# Patient Record
Sex: Male | Born: 1995 | Race: White | Hispanic: No | Marital: Single | Smoking: Never smoker
Health system: Southern US, Community
[De-identification: ages and names within clinical notes are randomized; demographics above are authoritative.]

## PROBLEM LIST (undated history)

## (undated) DIAGNOSIS — S83206A Unspecified tear of unspecified meniscus, current injury, right knee, initial encounter: Secondary | ICD-10-CM

## (undated) DIAGNOSIS — S83519A Sprain of anterior cruciate ligament of unspecified knee, initial encounter: Secondary | ICD-10-CM

## (undated) HISTORY — PX: SUPERFICIAL LYMPH NODE BIOPSY / EXCISION: SUR127

---
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
## 2016-09-10 ENCOUNTER — Ambulatory Visit (INDEPENDENT_AMBULATORY_CARE_PROVIDER_SITE_OTHER): Payer: BLUE CROSS/BLUE SHIELD | Admitting: Nurse Practitioner

## 2016-09-10 ENCOUNTER — Encounter: Payer: Self-pay | Admitting: Nurse Practitioner

## 2016-09-10 VITALS — BP 114/68 | HR 77 | Temp 97.8°F | Ht 74.0 in | Wt 180.0 lb

## 2016-09-10 DIAGNOSIS — R6889 Other general symptoms and signs: Secondary | ICD-10-CM | POA: Diagnosis not present

## 2016-09-10 DIAGNOSIS — N529 Male erectile dysfunction, unspecified: Secondary | ICD-10-CM | POA: Insufficient documentation

## 2016-09-10 DIAGNOSIS — L309 Dermatitis, unspecified: Secondary | ICD-10-CM | POA: Insufficient documentation

## 2016-09-10 DIAGNOSIS — R209 Unspecified disturbances of skin sensation: Secondary | ICD-10-CM

## 2016-09-10 MED ORDER — TRIAMCINOLONE ACETONIDE 0.5 % EX OINT: 1.0000 "application " | TOPICAL_OINTMENT | Freq: Two times a day (BID) | CUTANEOUS | 1 refills | Status: DC

## 2016-09-10 NOTE — Progress Notes (Signed)
Subjective:    Patient ID: Erik Barr, male    DOB: 1996-06-28, 21 y.o.   MRN: [id number]  Patient presents today for establish care (new patient) and evaluation for ED and rash.  Erectile Dysfunction  This is a new problem. The current episode started more than 1 month ago. The problem is unchanged. The nature of his difficulty is maintaining erection. He reports no anxiety, decreased libido or performance anxiety. He reports his erection duration to be 5 to 10 minutes. Irritative symptoms do not include frequency, nocturia or urgency. Obstructive symptoms do not include dribbling, incomplete emptying, an intermittent stream, straining or a weak stream. Pertinent negatives include no chills, dysuria, genital pain, hematuria, hesitancy or inability to urinate. Nothing aggravates the symptoms. Past treatments include nothing.  Rash  This is a recurrent problem. The current episode started more than 1 year ago. The problem has been waxing and waning since onset. The affected locations include the back, left arm and right arm. The rash is characterized by redness, scaling, itchiness and dryness. He was exposed to nothing. Pertinent negatives include no anorexia, congestion, cough, diarrhea, eye pain, facial edema, fatigue, fever, joint pain, nail changes, rhinorrhea, shortness of breath, sore throat or vomiting. Past treatments include moisturizer. The treatment provided mild relief. His past medical history is significant for allergies and eczema. There is no history of asthma or varicella.    referred to urologist in past for Ed, but patient did not make appt.  Immunizations: (TDAP, Hep C screen, Pneumovax, Influenza, zoster)  Health Maintenance  Topic Date Due  . Tetanus Vaccine  01/14/2015  . HIV Screening  08/26/2017*  . Flu Shot  Addressed  *Topic was postponed. The date shown is not the original due date.   Diet:regular Weight:  Wt Readings from Last 3 Encounters:  09/10/16 180 lb  (81.6 kg)  09/02/12 181 lb (82.1 kg) (92 %, Z= 1.38)*   * Growth percentiles are based on CDC 2-20 Years data.   Exercise:none Fall Risk: Fall Risk  09/10/2016  Falls in the past year? No   Home Safety:home with girlfriend Depression/Suicide: Depression screen [facility] 2/9 09/10/2016  Decreased Interest 0  Down, Depressed, Hopeless 0  PHQ - 2 Score 0   No flowsheet data found.  Vision:up to date Dental:needed Advanced Directive: Advanced Directives 08/31/2012  Does Patient Have a Medical Advance Directive? Not applicable, patient <36 years old   Sexual History (birth control, marital status, STD):single, sexually active, unprotected.  Medications and allergies reviewed with patient and updated if appropriate.  Patient Active Problem List   Diagnosis Date Noted  . ED (erectile dysfunction) 09/10/2016  . Eczema 09/10/2016    No current outpatient prescriptions on file prior to visit.   No current facility-administered medications on file prior to visit.     Past Medical History:  Diagnosis Date  . ACL tear 08/2012   right  . Right knee meniscal tear 08/2012   medial and lateral    Past Surgical History:  Procedure Laterality Date  . ANTERIOR CRUCIATE LIGAMENT REPAIR  09/02/2012   Procedure: ANTERIOR CRUCIATE LIGAMENT (ACL) REPAIR;  Surgeon: Alta Corning, MD;  Location: [place];  Service: Orthopedics;  Laterality: Right;  Anterior Cruciate Ligament Reconstruction with Autograft and Menicus Repair  . SUPERFICIAL LYMPH NODE BIOPSY / EXCISION      Social History   Social History  . Marital status: Single    Spouse name: N/A  . Number of children:  N/A  . Years of education: N/A   Social History Main Topics  . Smoking status: Never Smoker  . Smokeless tobacco: Never Used  . Alcohol use 7.2 oz/week    4 Cans of beer, 8 Shots of liquor per week  . Drug use: Yes    Frequency: 4.0 times per week    Types: Marijuana  . Sexual activity: Yes    Birth  control/ protection: None   Other Topics Concern  . None   Social History Narrative  . None    Family History  Problem Relation Age of Onset  . Diabetes Father   . Anesthesia problems Father     post-op N/V  . Heart disease Brother     hx. Kawasaki disease - now symptom-free  . Asthma Maternal Aunt   . Diabetes Paternal Uncle   . Diabetes Paternal Grandmother   . Hypertension Paternal Grandmother         Review of Systems  Constitutional: Negative for chills, fatigue, fever, malaise/fatigue and weight loss.  HENT: Negative for congestion, rhinorrhea and sore throat.   Eyes: Negative for pain.       Negative for visual changes  Respiratory: Negative for cough and shortness of breath.   Cardiovascular: Negative for chest pain, palpitations and leg swelling.  Gastrointestinal: Negative for anorexia, blood in stool, constipation, diarrhea, heartburn and vomiting.  Genitourinary: Negative for decreased libido, dysuria, frequency, hematuria, hesitancy, incomplete emptying, nocturia and urgency.  Musculoskeletal: Negative for falls, joint pain and myalgias.  Skin: Positive for rash. Negative for nail changes.  Neurological: Negative for dizziness, sensory change and headaches.  Endo/Heme/Allergies: Does not bruise/bleed easily.  Psychiatric/Behavioral: Negative for depression, hallucinations, substance abuse and suicidal ideas. The patient is not nervous/anxious and does not have insomnia.     Objective:   Vitals:   09/10/16 1436  BP: 114/68  Pulse: 77  Temp: 97.8 F (36.6 C)    Body mass index is 23.11 kg/m.   Physical Examination:  Physical Exam  Constitutional: He is oriented to person, place, and time and well-developed, well-nourished, and in no distress. No distress.  HENT:  Right Ear: External ear normal.  Left Ear: External ear normal.  Nose: Nose normal.  Mouth/Throat: Oropharynx is clear and moist. No oropharyngeal exudate.  Eyes: Conjunctivae and EOM  are normal. Pupils are equal, round, and reactive to light. No scleral icterus.  Neck: Normal range of motion. Neck supple. No thyromegaly present.  Cardiovascular: Normal rate, normal heart sounds and intact distal pulses.   Pulmonary/Chest: Effort normal and breath sounds normal. He exhibits no tenderness.  Abdominal: Soft. Bowel sounds are normal. He exhibits no distension. There is no tenderness.  Musculoskeletal: Normal range of motion. He exhibits no edema or tenderness.  Lymphadenopathy:    He has no cervical adenopathy.  Neurological: He is alert and oriented to person, place, and time. No cranial nerve deficit. Gait normal. Coordination normal.  Skin: Skin is warm and dry. Rash noted. Rash is macular.     Psychiatric: Affect and judgment normal.    ASSESSMENT and PLAN:  Rowyn was seen today for establish care.  Diagnoses and all orders for this visit:  Erectile dysfunction, unspecified erectile dysfunction type -     CBC w/Diff; Future -     Comp Met (CMET); Future -     TSH; Future -     Testosterone; Future  Cold extremities -     CBC w/Diff; Future -  Comp Met (CMET); Future -     TSH; Future -     Testosterone; Future  Eczema, unspecified type -     triamcinolone ointment (KENALOG) 0.5 %; Apply 1 application topically 2 (two) times daily.   No problem-specific Assessment & Plan notes found for this encounter.     Follow up: Return in about 3 months (around 12/09/2016) for erectile dysfunction and eczema.  Wilfred Lacy, NP

## 2016-09-10 NOTE — Patient Instructions (Addendum)
Refer to urologist if normal labs.  Atopic Dermatitis Atopic dermatitis is a skin disorder that causes inflammation of the skin. This is the most common type of eczema. Eczema is a group of skin conditions that cause the skin to be itchy, red, and swollen. This condition is generally worse during the cooler winter months and often improves during the warm summer months. Symptoms can vary from person to person. Atopic dermatitis usually starts showing signs in infancy and can last through adulthood. This condition cannot be passed from one person to another (non-contagious), but is more common in families. Atopic dermatitis may not always be present. When it is present, it is called a flare-up. What are the causes? The exact cause of this condition is not known. Flare-ups of the condition may be triggered by:  Contact with something you are sensitive or allergic to.  Stress.  Certain foods.  Extremely hot or cold weather.  Harsh chemicals and soaps.  Dry air.  Chlorine. What increases the risk? This condition is more likely to develop in people who have a personal history or family history of eczema, allergies, asthma, or hay fever. What are the signs or symptoms? Symptoms of this condition include:  Dry, scaly skin.  Red, itchy rash.  Itchiness, which can be severe. This may occur before the skin rash. This can make sleeping difficult.  Skin thickening and cracking can occur over time. How is this diagnosed? This condition is diagnosed based on your symptoms, a medical history, and a physical exam. How is this treated? There is no cure for this condition, but symptoms can usually be controlled. Treatment focuses on:  Controlling the itching and scratching. You may be given medicines, such as antihistamines or steroid creams.  Limiting exposure to things that you are sensitive or allergic to (allergens).  Recognizing situations that cause stress and developing a plan to  manage stress. If your atopic dermatitis does not get better with medicines or is all over your body (widespread) , a treatment using a specific type of light (phototherapy) may be used. Follow these instructions at home: Skin care  Keep your skin well-moisturized. This seals in moisture and help prevent dryness.  Use unscented lotions that have petroleum in them.  Avoid lotions that contain alcohol and water. They can dry the skin.  Keep baths or showers short (less than 5 minutes) in warm water. Do not use hot water.  Use mild, unscented cleansers for bathing. Avoid soap and bubble bath.  Apply a moisturizer to your skin right after a bath or shower.   Do not apply anything to your skin without checking with your health care provider. General instructions  Dress in clothes made of cotton or cotton blends. Dress lightly because heat increases itching.  When washing your clothes, rinse your clothes twice so all of the soap is removed.  Avoid any triggers that can cause a flare-up.  Try to manage your stress.  Keep your fingernails cut short.  Avoid scratching. Scratching makes the rash and itching worse. It may also result in a skin infection (impetigo) due to a break in the skin caused by scratching.  Take or apply over-the-counter and prescription medicines only as told by your health care provider.  Keep all follow-up visits as told by your health care provider. This is important.  Do not be around people who have cold sores or fever blisters. If you get the infection, it may cause your atopic dermatitis to worsen. Contact a  health care provider if:  Your itching interferes with sleep.  Your rash gets worse or is not better within one week of starting treatment.  You have a fever.  You have a rash flare-up after having contact with someone who has cold sores or fever blisters. Get help right away if:  You develop pus or soft yellow scabs in the rash  area. Summary  This condition causes a red rash and itchy, dry, scaly skin.  Treatment focuses on controlling the itching and scratching, limiting exposure to things that you are sensitive or allergic to (allergens), and recognizing situations that cause stress and developing a plan to manage stress.  Keep your skin well-moisturized.  Keep baths or showers less than 5 minutes. This information is not intended to replace advice given to you by your health care provider. Make sure you discuss any questions you have with your health care provider. Document Released: 08/09/2000 Document Revised: 01/18/2016 Document Reviewed: 03/15/2013 Elsevier Interactive Patient Education  2017 [employer]  Erectile Dysfunction Erectile dysfunction is the inability to get or sustain a good enough erection to have sexual intercourse. Erectile dysfunction may involve:  Inability to get an erection.  Lack of enough hardness to allow penetration.  Loss of the erection before sex is finished.  Premature ejaculation. CAUSES  Certain drugs, such as:  Pain relievers.  Antihistamines.  Antidepressants.  Blood pressure medicines.  Water pills (diuretics).  Ulcer medicines.  Muscle relaxants.  Illegal drugs.  Excessive drinking.  Psychological causes, such as:  Anxiety.  Depression.  Sadness.  Exhaustion.  Performance fear.  Stress.  Physical causes, such as:  Artery problems. This may include diabetes, smoking, liver disease, or atherosclerosis.  High blood pressure.  Hormonal problems, such as low testosterone.  Obesity.  Nerve problems. This may include back or pelvic injuries, diabetes mellitus, multiple sclerosis, or Parkinson disease. SYMPTOMS  Inability to get an erection.  Lack of enough hardness to allow penetration.  Loss of the erection before sex is finished.  Premature ejaculation.  Normal erections at some times, but with frequent unsatisfactory  episodes.  Orgasms that are not satisfactory in sensation or frequency.  Low sexual satisfaction in either partner because of erection problems.  A curved penis occurring with erection. The curve may cause pain or may be too curved to allow for intercourse.  Never having nighttime erections. DIAGNOSIS Your caregiver can often diagnose this condition by:  Performing a physical exam to find other diseases or specific problems with the penis.  Asking you detailed questions about the problem.  Performing blood tests to check for diabetes mellitus or to measure hormone levels.  Performing urine tests to find other underlying health conditions.  Performing an ultrasound exam to check for scarring.  Performing a test to check blood flow to the penis.  Doing a sleep study at home to measure nighttime erections. TREATMENT   You may be prescribed medicines by mouth.  You may be given medicine injections into the penis.  You may be prescribed a vacuum pump with a ring.  Penile implant surgery may be performed. You may receive:  An inflatable implant.  A semirigid implant.  Blood vessel surgery may be performed. HOME CARE INSTRUCTIONS  If you are prescribed oral medicine, you should take the medicine as prescribed. Do not increase the dosage without first discussing it with your physician.  If you are using self-injections, be careful to avoid any veins that are on the surface of the penis. Apply  pressure to the injection site for 5 minutes.  If you are using a vacuum pump, make sure you have read the instructions before using it. Discuss any questions with your physician before taking the pump home. SEEK MEDICAL CARE IF:  You experience pain that is not responsive to the pain medicine you have been prescribed.  You experience nausea or vomiting. SEEK IMMEDIATE MEDICAL CARE IF:   When taking oral or injectable medications, you experience an erection that lasts longer than 4  hours. If your physician is unavailable, go to the nearest emergency room for evaluation. An erection that lasts much longer than 4 hours can result in permanent damage to your penis.  You have pain that is severe.  You develop redness, severe pain, or severe swelling of your penis.  You have redness spreading up into your groin or lower abdomen.  You are unable to pass your urine. This information is not intended to replace advice given to you by your health care provider. Make sure you discuss any questions you have with your health care provider. Document Released: 08/09/2000 Document Revised: 04/14/2013 Document Reviewed: 01/14/2013 Elsevier Interactive Patient Education  2017 [employer].

## 2016-09-10 NOTE — Progress Notes (Signed)
Pre visit review using our clinic review tool, if applicable. No additional management support is needed unless otherwise documented below in the visit note. 

## 2016-09-13 ENCOUNTER — Other Ambulatory Visit (INDEPENDENT_AMBULATORY_CARE_PROVIDER_SITE_OTHER): Payer: BLUE CROSS/BLUE SHIELD

## 2016-09-13 DIAGNOSIS — N529 Male erectile dysfunction, unspecified: Secondary | ICD-10-CM | POA: Diagnosis not present

## 2016-09-13 DIAGNOSIS — R7989 Other specified abnormal findings of blood chemistry: Secondary | ICD-10-CM

## 2016-09-13 LAB — CBC WITH DIFFERENTIAL/PLATELET
Basophils Absolute: 0 10*3/uL (ref 0.0–0.1)
Basophils Relative: 0.2 % (ref 0.0–3.0)
EOS ABS: 0.1 10*3/uL (ref 0.0–0.7)
Eosinophils Relative: 2.1 % (ref 0.0–5.0)
HCT: 44.8 % (ref 39.0–52.0)
HEMOGLOBIN: 15.3 g/dL (ref 13.0–17.0)
LYMPHS ABS: 1.8 10*3/uL (ref 0.7–4.0)
Lymphocytes Relative: 26.9 % (ref 12.0–46.0)
MCHC: 34.1 g/dL (ref 30.0–36.0)
MCV: 87.3 fl (ref 78.0–100.0)
MONO ABS: 0.5 10*3/uL (ref 0.1–1.0)
Monocytes Relative: 8 % (ref 3.0–12.0)
NEUTROS PCT: 62.8 % (ref 43.0–77.0)
Neutro Abs: 4.3 10*3/uL (ref 1.4–7.7)
Platelets: 268 10*3/uL (ref 150.0–400.0)
RBC: 5.13 Mil/uL (ref 4.22–5.81)
RDW: 12 % (ref 11.5–14.6)
WBC: 6.8 10*3/uL (ref 4.5–10.5)

## 2016-09-13 LAB — COMPREHENSIVE METABOLIC PANEL
ALBUMIN: 4.6 g/dL (ref 3.5–5.2)
ALT: 11 U/L (ref 0–53)
AST: 16 U/L (ref 0–37)
Alkaline Phosphatase: 56 U/L (ref 39–117)
BUN: 12 mg/dL (ref 6–23)
CO2: 33 mEq/L — ABNORMAL HIGH (ref 19–32)
CREATININE: 1.15 mg/dL (ref 0.40–1.50)
Calcium: 9.6 mg/dL (ref 8.4–10.5)
Chloride: 100 mEq/L (ref 96–112)
GFR: 85.6 mL/min (ref >60.00)
GLUCOSE: 91 mg/dL (ref 70–99)
Potassium: 3.8 mEq/L (ref 3.5–5.1)
SODIUM: 140 meq/L (ref 135–145)
Total Bilirubin: 0.8 mg/dL (ref 0.2–1.2)
Total Protein: 7.2 g/dL (ref 6.0–8.3)

## 2016-09-13 LAB — TSH: TSH: 0.89 u[IU]/mL (ref 0.35–5.50)

## 2016-09-13 LAB — TESTOSTERONE: Testosterone: 309.4 ng/dL — ABNORMAL LOW (ref 350.00–890.00)

## 2016-09-16 ENCOUNTER — Telehealth: Payer: Self-pay | Admitting: Nurse Practitioner

## 2016-09-16 NOTE — Telephone Encounter (Signed)
Pt had a missed call from us, his phone ( listed in EPIC ) is not working. He is assuming we were calling with [facility] lab results [facility] Please return his call at [phone]

## 2016-09-27 ENCOUNTER — Other Ambulatory Visit (INDEPENDENT_AMBULATORY_CARE_PROVIDER_SITE_OTHER): Payer: BLUE CROSS/BLUE SHIELD

## 2016-09-27 DIAGNOSIS — N529 Male erectile dysfunction, unspecified: Secondary | ICD-10-CM

## 2016-09-27 DIAGNOSIS — E291 Testicular hypofunction: Secondary | ICD-10-CM

## 2016-09-27 DIAGNOSIS — R7989 Other specified abnormal findings of blood chemistry: Secondary | ICD-10-CM | POA: Insufficient documentation

## 2016-09-27 LAB — FOLLICLE STIMULATING HORMONE: FSH: 4.8 m[IU]/mL (ref 1.4–18.1)

## 2016-09-27 LAB — TESTOSTERONE: Testosterone: 497.41 ng/dL (ref 350.00–890.00)

## 2016-09-27 LAB — LUTEINIZING HORMONE: LH: 7.36 m[IU]/mL (ref 1.50–9.30)

## 2016-09-30 ENCOUNTER — Telehealth: Payer: Self-pay | Admitting: Nurse Practitioner

## 2016-09-30 NOTE — Telephone Encounter (Signed)
Patient called in gave [facility] response on labs.

## 2016-10-11 ENCOUNTER — Emergency Department (HOSPITAL_COMMUNITY)
Admission: EM | Admit: 2016-10-11 | Discharge: 2016-10-11 | Disposition: A | Discharge status: Home / Self Care | Payer: BLUE CROSS/BLUE SHIELD | Attending: Emergency Medicine | Admitting: Emergency Medicine

## 2016-10-11 ENCOUNTER — Emergency Department (HOSPITAL_COMMUNITY): Payer: BLUE CROSS/BLUE SHIELD

## 2016-10-11 ENCOUNTER — Encounter (HOSPITAL_COMMUNITY): Payer: Self-pay | Admitting: Emergency Medicine

## 2016-10-11 DIAGNOSIS — R52 Pain, unspecified: Secondary | ICD-10-CM

## 2016-10-11 DIAGNOSIS — Z79899 Other long term (current) drug therapy: Secondary | ICD-10-CM | POA: Diagnosis not present

## 2016-10-11 DIAGNOSIS — W260XXA Contact with knife, initial encounter: Secondary | ICD-10-CM | POA: Insufficient documentation

## 2016-10-11 DIAGNOSIS — S61214A Laceration without foreign body of right ring finger without damage to nail, initial encounter: Secondary | ICD-10-CM | POA: Insufficient documentation

## 2016-10-11 DIAGNOSIS — Y939 Activity, unspecified: Secondary | ICD-10-CM | POA: Insufficient documentation

## 2016-10-11 DIAGNOSIS — Y999 Unspecified external cause status: Secondary | ICD-10-CM | POA: Diagnosis not present

## 2016-10-11 DIAGNOSIS — S61216A Laceration without foreign body of right little finger without damage to nail, initial encounter: Secondary | ICD-10-CM

## 2016-10-11 DIAGNOSIS — Y929 Unspecified place or not applicable: Secondary | ICD-10-CM | POA: Diagnosis not present

## 2016-10-11 MED ORDER — IBUPROFEN 800 MG PO TABS
800.0000 mg | ORAL_TABLET | Freq: Once | ORAL | Status: AC
  Administered 2016-10-11: 800 mg via ORAL
  Filled 2016-10-11: qty 1

## 2016-10-11 MED ORDER — IBUPROFEN 600 MG PO TABS: 600.0000 mg | ORAL_TABLET | Freq: Four times a day (QID) | ORAL | 0 refills | Status: DC | PRN

## 2016-10-11 MED ORDER — LIDOCAINE HCL (PF) 1 % IJ SOLN
5.0000 mL | Freq: Once | INTRAMUSCULAR | Status: AC
  Administered 2016-10-11: 5 mL
  Filled 2016-10-11: qty 30

## 2016-10-11 NOTE — ED Triage Notes (Signed)
Pt comes in w/ Laceration to Right pinky and middle finger. Injury approx 1pm. Actively bleeding. Pressure applied. Tetanus shot up to date per pt.

## 2016-10-11 NOTE — ED Notes (Signed)
Shanda BumpsJessica, PA-C at bedside for laceration repair.

## 2016-10-11 NOTE — ED Provider Notes (Signed)
[facility]-EMERGENCY DEPT Provider Note   CSN: [id number] Arrival date & time: 10/11/16  1523  By signing my name below, I, Doreatha Martin, attest that this documentation has been prepared under the direction and in the presence of  Mathews Robinsons, PA-C. Electronically Signed: Doreatha Martin, ED Scribe. 10/11/16. 4:40 PM.    History   Chief Complaint Chief Complaint  Patient presents with  . Laceration    Right Hand    HPI Erik Barr is a 21 y.o. male otherwise healthy on no daily medications who presents to the Emergency Department complaining of lacerations with controlled bleeding to the right ring and pinky fingers that occurred earlier this afternoon. Pt states he stuck a knife in a cutting board while arguing, not realizing that his hand was in the way. Pt reports associated moderate pain to the fingers that is worsened with finger flexion, in addition to tingling of the pinky. Pt denies taking OTC medications at home to improve symptoms. He denies additional injuries. NKDA.   The history is provided by the patient. No language interpreter was used.  Laceration   The incident occurred 1 to 2 hours ago. The laceration is located on the right hand. The laceration mechanism was a a clean knife. The pain is moderate. The pain has been constant since onset. His tetanus status is UTD.    Past Medical History:  Diagnosis Date  . ACL tear 08/2012   right  . Right knee meniscal tear 08/2012   medial and lateral    Patient Active Problem List   Diagnosis Date Noted  . ED (erectile dysfunction) 09/10/2016  . Eczema 09/10/2016    Past Surgical History:  Procedure Laterality Date  . ANTERIOR CRUCIATE LIGAMENT REPAIR  09/02/2012   Procedure: ANTERIOR CRUCIATE LIGAMENT (ACL) REPAIR;  Surgeon: Harvie Junior, MD;  Location: [facility];  Service: Orthopedics;  Laterality: Right;  Anterior Cruciate Ligament Reconstruction with Autograft and Menicus Repair  . SUPERFICIAL LYMPH  NODE BIOPSY / EXCISION         Home Medications    Prior to Admission medications   Medication Sig Start Date End Date Taking? Authorizing Provider  ibuprofen (ADVIL,MOTRIN) 600 MG tablet Take 1 tablet (600 mg total) by mouth every 6 (six) hours as needed. 10/11/16   Georgiana Shore, PA-C  triamcinolone ointment (KENALOG) 0.5 % Apply 1 application topically 2 (two) times daily. 09/10/16   Anne Ng, NP    Family History Family History  Problem Relation Age of Onset  . Diabetes Father   . Anesthesia problems Father     post-op N/V  . Heart disease Brother     hx. Kawasaki disease - now symptom-free  . Asthma Maternal Aunt   . Diabetes Paternal Uncle   . Diabetes Paternal Grandmother   . Hypertension Paternal Grandmother     Social History Social History  Substance Use Topics  . Smoking status: Never Smoker  . Smokeless tobacco: Never Used  . Alcohol use Yes     Comment: occasional     Allergies   Patient has no known allergies.   Review of Systems Review of Systems  Musculoskeletal: Positive for myalgias (right hand).  Skin: Positive for wound.  Neurological:       +tingling right pinky     Physical Exam Updated Vital Signs BP 137/66 (BP Location: Left Arm)   Pulse 76   Temp 98.2 F (36.8 C) (Oral)   Resp 14   Ht  6\' 1"  (1.854 m)   Wt 81.6 kg   SpO2 100%   BMI 23.75 kg/m   Physical Exam  Constitutional: He appears well-developed and well-nourished.  Patient is afebrile, non-toxic appearing, seating comfortably in chair in no acute distress.   HENT:  Head: Normocephalic.  Eyes: Conjunctivae are normal.  Cardiovascular: Normal rate, regular rhythm and normal heart sounds.   Pulmonary/Chest: Effort normal and breath sounds normal. No respiratory distress. He has no wheezes. He has no rales.  Abdominal: He exhibits no distension.  Musculoskeletal: Normal range of motion.  FAROM of the ring finger, but significantly reduced ROM d/t pain of  the little finger. Cannot bend at the DIP. 1 cm laceration to the 4th and 5th digits.    Neurological: He is alert.  Skin: Skin is warm and dry.  Psychiatric: He has a normal mood and affect. His behavior is normal.  Nursing note and vitals reviewed.    ED Treatments / Results   DIAGNOSTIC STUDIES: Oxygen Saturation is 98% on RA, normal by my interpretation.    COORDINATION OF CARE: 4:35 PM Discussed treatment plan with pt at bedside which includes XR, wound care and pt agreed to plan.    Labs (all labs ordered are listed, but only abnormal results are displayed) Labs Reviewed - No data to display  EKG  EKG Interpretation None       Radiology Dg Hand Complete Right  Result Date: 10/11/2016 CLINICAL DATA:  Laceration with kitchen knife pinky and ring finger palmar aspect EXAM: RIGHT HAND - COMPLETE 3+ VIEW COMPARISON:  None. FINDINGS: Three views of the right hand submitted. No acute fracture or subluxation. No radiopaque foreign body is identified. IMPRESSION: Negative. Electronically Signed   By: Natasha Mead M.D.   On: 10/11/2016 17:10    Procedures Procedures (including critical care time)  NERVE BLOCK Performed by: Mathews Robinsons, PA-C Consent: Verbal consent obtained. Required items: required blood products, implants, devices, and special equipment available Time out: Immediately prior to procedure a "time out" was called to verify the correct patient, procedure, equipment, support staff and site/side marked as required. Indication: laceration repair  Nerve block body site: right 5th digit  Preparation: Patient was prepped and draped in the usual sterile fashion. Needle gauge: 25 G Location technique: anatomical landmarks Local anesthetic: 2 mL 1% lidocaine without epinephrine Outcome: pain improved Patient tolerance: Patient tolerated the procedure well with no immediate complications.  NERVE BLOCK Performed by: Mathews Robinsons, PA-C Consent: Verbal consent  obtained. Required items: required blood products, implants, devices, and special equipment available Time out: Immediately prior to procedure a "time out" was called to verify the correct patient, procedure, equipment, support staff and site/side marked as required. Indication: laceration repair  Nerve block body site: right 4th digit  Preparation: Patient was prepped and draped in the usual sterile fashion. Needle gauge: 25 G Location technique: anatomical landmarks Local anesthetic:  2 mL 1% lidocaine without epinephrine Outcome: pain improved Patient tolerance: Patient tolerated the procedure well with no immediate complications.    LACERATION REPAIR Performed by: Mathews Robinsons, PA-C Consent: Verbal consent obtained. Risks and benefits: risks, benefits and alternatives were discussed Patient identity confirmed: provided demographic data Time out performed prior to procedure Prepped and Draped in normal sterile fashion Wound explored Laceration Location: right 4th digit, palmar surface  Laceration Length: 1cm No Foreign Bodies seen or palpated Anesthesia: Digital blocks as detailed above Irrigation method: syringe Amount of cleaning: standard Skin closure: 5-0 prolene  Number  of sutures: 4 Technique: simple interrupted Patient tolerance: Patient tolerated the procedure well with no immediate complications.   LACERATION REPAIR Performed by: Mathews RobinsonsJessica Ramon Brant, PA-C Consent: Verbal consent obtained. Risks and benefits: risks, benefits and alternatives were discussed Patient identity confirmed: provided demographic data Time out performed prior to procedure Prepped and Draped in normal sterile fashion Wound explored Laceration Location: right 4th digit, palmar surface  Laceration Length: 1cm No Foreign Bodies seen or palpated Anesthesia: Digital blocks as detailed above Irrigation method: syringe Amount of cleaning: standard Skin closure: 5-0 prolene  Number of  sutures: 4 Technique: simple interrupted Patient tolerance: Patient tolerated the procedure well with no immediate complications.   Medications Ordered in ED Medications  lidocaine (PF) (XYLOCAINE) 1 % injection 5 mL (5 mLs Infiltration Given 10/11/16 1650)  ibuprofen (ADVIL,MOTRIN) tablet 800 mg (800 mg Oral Given 10/11/16 1649)     Initial Impression / Assessment and Plan / ED Course  I have reviewed the triage vital signs and the nursing notes.  Pertinent labs & imaging results that were available during my care of the patient were reviewed by me and considered in my medical decision making (see chart for details).     Otherwise healthy 21 y/o presenting with finger lacerations. Patient had sensation distally but difficulty moving his little finger with reduced rom at the DIP. Wound was inspected and no obvious tendon injury visualized.  Tdap UTD. XR right hand negative. Pressure irrigation performed. Laceration occurred < 8 hours prior to repair which was well-tolerated. Pt has no co morbidities to effect normal wound healing. Discussed suture home care w pt and answered questions. Pt to f-u for wound check and suture removal in 7 days. Pt is hemodynamically stable w no complaints prior to dc.     Discharge home with hand surgery follow up to reassess for tendon integrity and rom. Fingers were dressed and splinted.  Discussed strict return precautions. Patient was advised to return to the emergency department if experiencing any new or worsening symptoms. Patient clearly understood instructions and agreed with discharge plan.   Patient was discussed with Dr. Donnald GarrePfeiffer who also has seen patient, inspected the wound and agrees with assessment and plan. Recommended f/u with ortho.  Final Clinical Impressions(s) / ED Diagnoses   Final diagnoses:  Laceration of right little finger without foreign body without damage to nail, initial encounter  Laceration of right ring finger without  foreign body without damage to nail, initial encounter    New Prescriptions Discharge Medication List as of 10/11/2016  8:22 PM    START taking these medications   Details  ibuprofen (ADVIL,MOTRIN) 600 MG tablet Take 1 tablet (600 mg total) by mouth every 6 (six) hours as needed., Starting Fri 10/11/2016, Print        I personally performed the services described in this documentation, which was scribed in my presence. The recorded information has been reviewed and is accurate.    Georgiana ShoreJessica B Lamarco Gudiel, PA-C 10/11/16 2153    Arby BarretteMarcy Pfeiffer, MD 10/21/16 [phone]

## 2016-12-11 ENCOUNTER — Ambulatory Visit: Payer: BLUE CROSS/BLUE SHIELD | Admitting: Nurse Practitioner

## 2016-12-11 ENCOUNTER — Encounter: Payer: Self-pay | Admitting: Nurse Practitioner

## 2016-12-11 DIAGNOSIS — E291 Testicular hypofunction: Secondary | ICD-10-CM | POA: Insufficient documentation

## 2016-12-11 DIAGNOSIS — F524 Premature ejaculation: Secondary | ICD-10-CM | POA: Insufficient documentation

## 2017-07-26 IMAGING — CR DG HAND COMPLETE 3+V*R*
3 series · 3 of 3 positions shown · non-contrast
Comparison: None.

CLINICAL DATA: Laceration with kitchen knife pinky and ring finger
palmar aspect

EXAM:
RIGHT HAND - COMPLETE 3+ VIEW

[x hand pa right]
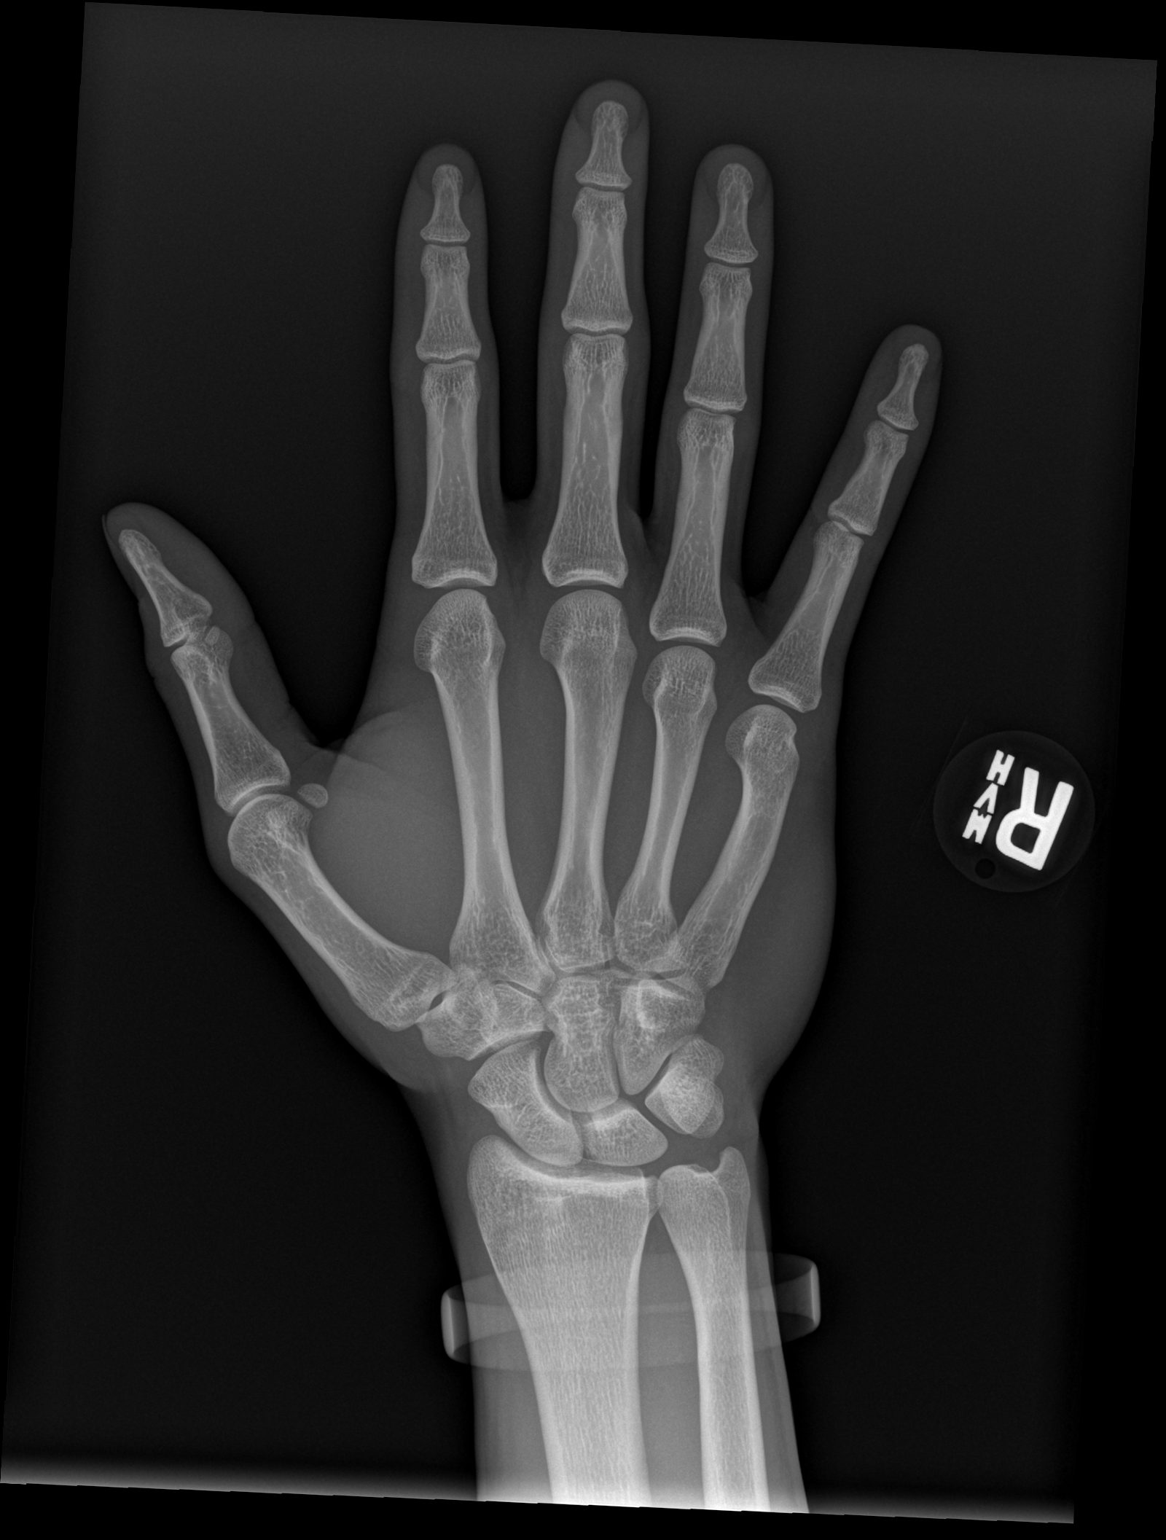

[x hand obl right]
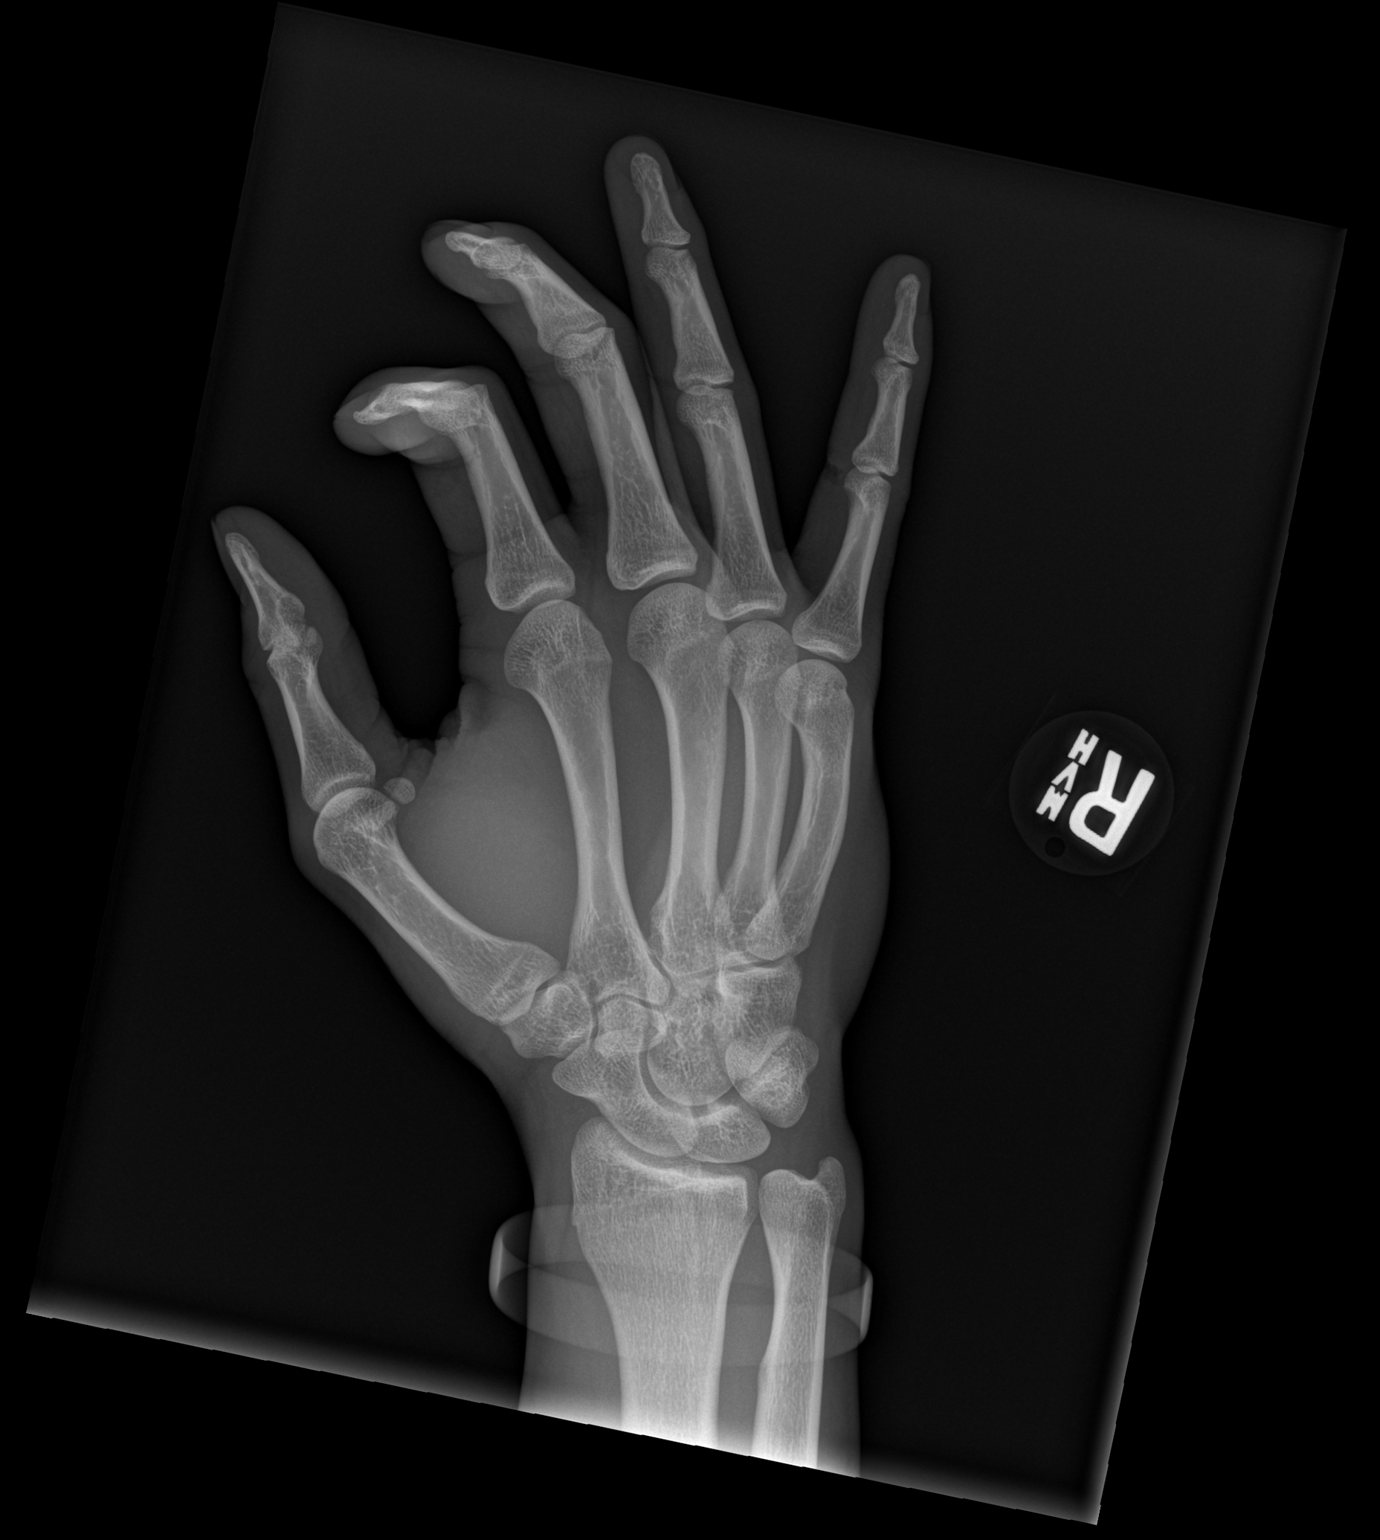

[x hand lat right]
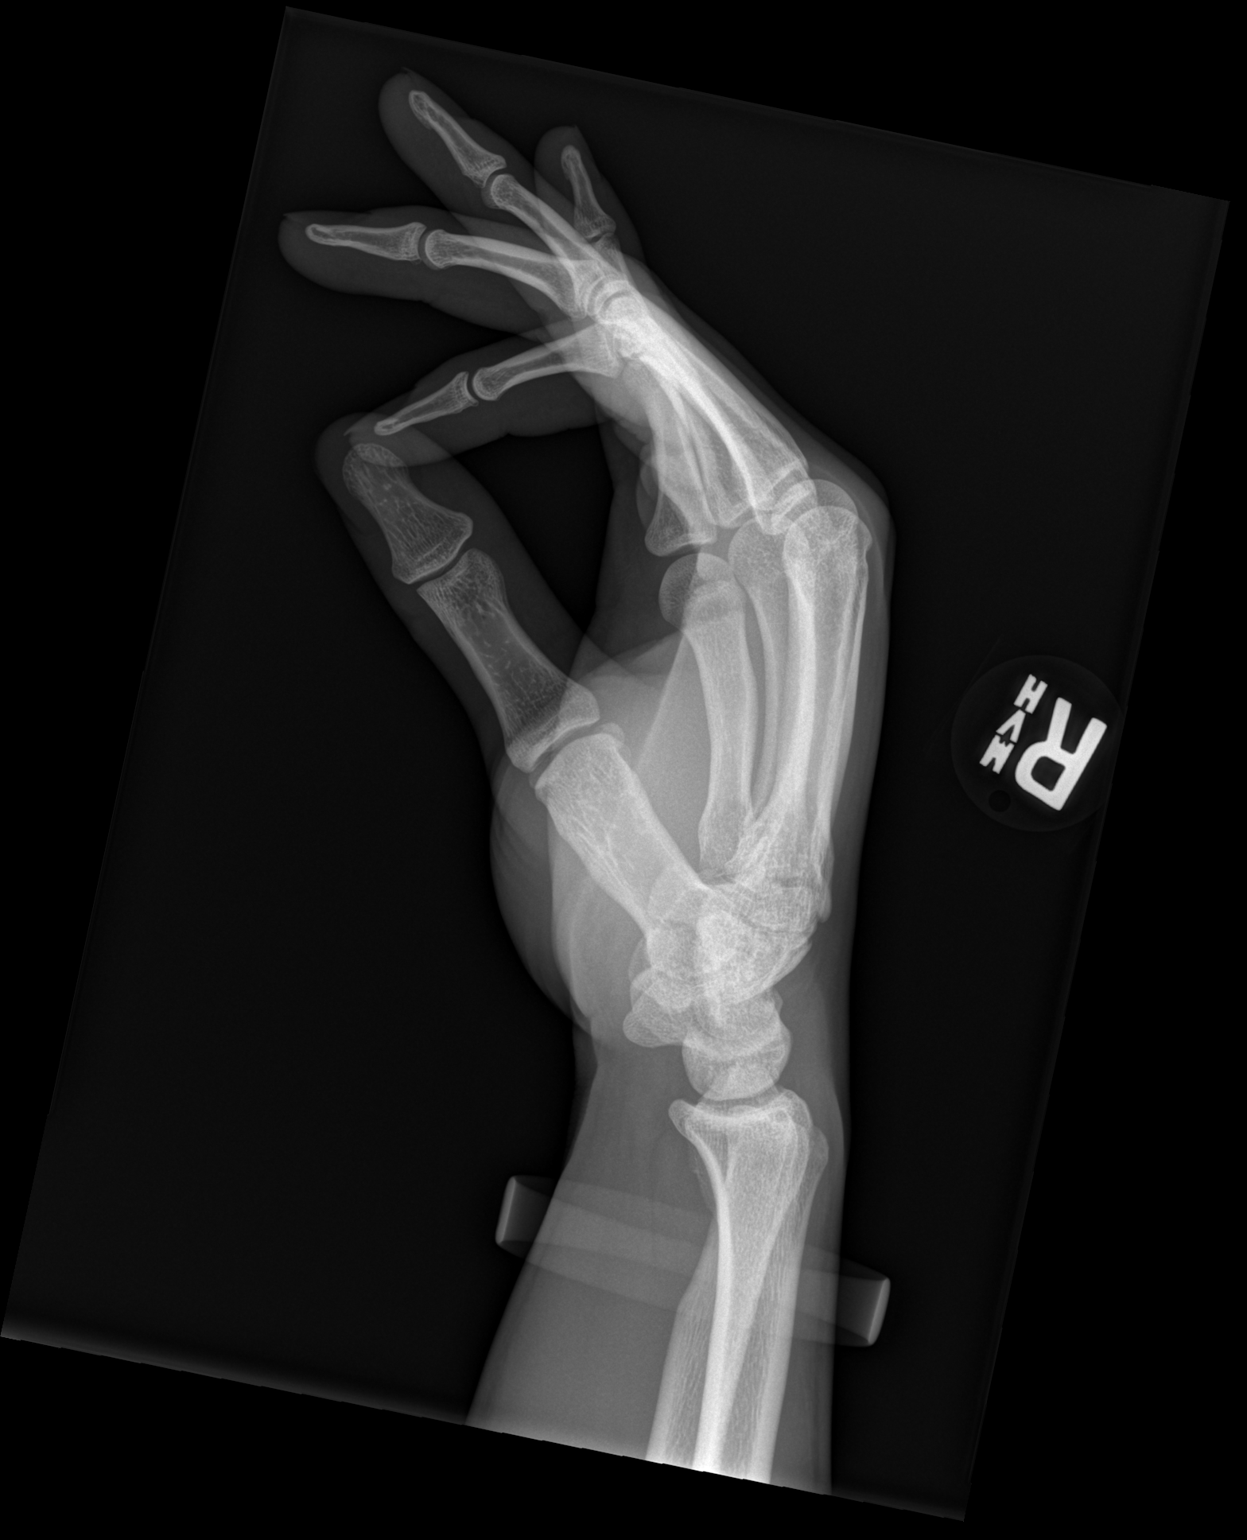

[3 of 3 positions shown; findings below may reference images not displayed]

FINDINGS: Three views of the right hand submitted. No acute fracture or
subluxation. No radiopaque foreign body is identified.
IMPRESSION: Negative.

## 2018-12-01 ENCOUNTER — Emergency Department (HOSPITAL_COMMUNITY)
Admission: EM | Admit: 2018-12-01 | Discharge: 2018-12-01 | Discharge status: Against Medical Advice | Payer: BLUE CROSS/BLUE SHIELD

## 2018-12-01 NOTE — ED Triage Notes (Signed)
Pt decided he didn't want to be seen

## 2018-12-01 NOTE — ED Notes (Signed)
Pt decided not to be seen charge RN aware

## 2020-11-20 ENCOUNTER — Other Ambulatory Visit: Payer: Self-pay

## 2020-11-20 ENCOUNTER — Encounter (HOSPITAL_COMMUNITY): Payer: Self-pay

## 2020-11-20 ENCOUNTER — Emergency Department (HOSPITAL_COMMUNITY)
Admission: EM | Admit: 2020-11-20 | Discharge: 2020-11-20 | Disposition: A | Discharge status: Home / Self Care | Payer: BC Managed Care – PPO | Attending: Emergency Medicine | Admitting: Emergency Medicine

## 2020-11-20 DIAGNOSIS — Z20822 Contact with and (suspected) exposure to covid-19: Secondary | ICD-10-CM | POA: Diagnosis not present

## 2020-11-20 DIAGNOSIS — R112 Nausea with vomiting, unspecified: Secondary | ICD-10-CM | POA: Diagnosis present

## 2020-11-20 DIAGNOSIS — R6883 Chills (without fever): Secondary | ICD-10-CM | POA: Insufficient documentation

## 2020-11-20 DIAGNOSIS — R000 Tachycardia, unspecified: Secondary | ICD-10-CM | POA: Insufficient documentation

## 2020-11-20 DIAGNOSIS — R5383 Other fatigue: Secondary | ICD-10-CM | POA: Diagnosis not present

## 2020-11-20 DIAGNOSIS — R531 Weakness: Secondary | ICD-10-CM | POA: Diagnosis not present

## 2020-11-20 MED ORDER — ONDANSETRON 4 MG PO TBDP: 4.0000 mg | ORAL_TABLET | Freq: Three times a day (TID) | ORAL | 0 refills | Status: DC | PRN

## 2020-11-20 MED ORDER — ONDANSETRON 4 MG PO TBDP
4.0000 mg | ORAL_TABLET | Freq: Once | ORAL | Status: AC
  Administered 2020-11-20: 4 mg via ORAL
  Filled 2020-11-20: qty 1

## 2020-11-20 NOTE — ED Provider Notes (Signed)
[facility]-EMERGENCY DEPT Provider Note   CSN: [id number] Arrival date & time: 11/20/20  2029     History Chief Complaint  Patient presents with  . Nausea    Erik Barr is a 25 y.o. male with no relevant past medical history presents the ED with complaints of chills and intractable nausea and vomiting.  Patient reports that he, his girlfriend, and another couple went out to dinner last evening.  They drink alcohol, but he states that he did not have too much.  He woke up this morning and he felt fatigued, weak, and nauseated.  He has had 3 episodes of nonbloody emesis today.  He also feels as though he is having extreme chills.  He reports that he took his temperature at home and it was 44 F.  He is 97.5 F here in the ED.  His heart rate is mildly elevated at 100 bpm, but he states that he has not been eating and drinking well at home.  He states that he can consume cantaloupe, but has not been able to eat or drink anything else.  He also reports that he checked his heart rate on his watch earlier today and it was very high.  He has a primary care provider.  He denies any current chest pain, shortness of breath, abdominal pain, urinary symptoms, or changes in his bowel habits.  He states that this is happened in the past and he is always negative for COVID-19.  He has not been immunized.  He reports that his friend's girlfriend who is at dinner with him last night has recently been ill.  HPI     Past Medical History:  Diagnosis Date  . ACL tear 08/2012   right  . Right knee meniscal tear 08/2012   medial and lateral    Patient Active Problem List   Diagnosis Date Noted  . 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 ED (erectile dysfunction) 09/10/2016  . Eczema 09/10/2016    Past Surgical History:  Procedure Laterality Date  . ANTERIOR CRUCIATE LIGAMENT REPAIR  09/02/2012   Procedure: ANTERIOR CRUCIATE LIGAMENT (ACL)  REPAIR;  Surgeon: Harvie Junior, MD;  Location: [facility];  Service: Orthopedics;  Laterality: Right;  Anterior Cruciate Ligament Reconstruction with Autograft and Menicus Repair  . SUPERFICIAL LYMPH NODE BIOPSY / EXCISION         Family History  Problem Relation Age of Onset  . Diabetes Father   . Anesthesia problems Father        post-op N/V  . Heart disease Brother        hx. Kawasaki disease - now symptom-free  . Asthma Maternal Aunt   . Diabetes Paternal Uncle   . Diabetes Paternal Grandmother   . Hypertension Paternal Grandmother     Social History   Tobacco Use  . Smoking status: Never Smoker  . Smokeless tobacco: Never Used  Substance Use Topics  . Alcohol use: Yes    Comment: occasional  . Drug use: Yes    Frequency: 4.0 times per week    Types: Marijuana    Home Medications Prior to Admission medications   Medication Sig Start Date End Date Taking? Authorizing Provider  ondansetron (ZOFRAN ODT) 4 MG disintegrating tablet Take 1 tablet (4 mg total) by mouth every 8 (eight) hours as needed for nausea or vomiting. 11/20/20  Yes Lorelee New, PA-C  ibuprofen (ADVIL,MOTRIN) 600 MG tablet Take 1 tablet (  600 mg total) by mouth every 6 (six) hours as needed. 10/11/16   Mathews Robinsons B, PA-C  triamcinolone ointment (KENALOG) 0.5 % Apply 1 application topically 2 (two) times daily. 09/10/16   Nche, Bonna Gains, NP    Allergies    Patient has no known allergies.  Review of Systems   Review of Systems  All other systems reviewed and are negative.   Physical Exam Updated Vital Signs BP 111/67   Pulse 98   Temp (!) 97.5 F (36.4 C) (Oral)   Resp (!) 22   SpO2 98%   Physical Exam Vitals and nursing note reviewed. Exam conducted with a chaperone present.  Constitutional:      General: He is not in acute distress.    Appearance: He is not toxic-appearing.  HENT:     Head: Normocephalic and atraumatic.  Eyes:     General: No scleral  icterus.    Conjunctiva/sclera: Conjunctivae normal.  Neck:     Comments: No meningismus. Cardiovascular:     Rate and Rhythm: Regular rhythm. Tachycardia present.     Pulses: Normal pulses.     Heart sounds: Normal heart sounds.  Pulmonary:     Effort: Pulmonary effort is normal. No respiratory distress.     Breath sounds: Normal breath sounds. No wheezing or rales.  Abdominal:     General: Abdomen is flat. There is no distension.     Palpations: Abdomen is soft.     Tenderness: There is no abdominal tenderness. There is no guarding.     Comments: Soft, nondistended.  No areas of tenderness.  No guarding.  Normoactive bowel sounds.  Musculoskeletal:     Cervical back: Normal range of motion. No rigidity.  Skin:    General: Skin is warm and dry.  Neurological:     Mental Status: He is alert and oriented to person, place, and time.     GCS: GCS eye subscore is 4. GCS verbal subscore is 5. GCS motor subscore is 6.  Psychiatric:        Mood and Affect: Mood normal.        Behavior: Behavior normal.        Thought Content: Thought content normal.     ED Results / Procedures / Treatments   Labs (all labs ordered are listed, but only abnormal results are displayed) Labs Reviewed  SARS CORONAVIRUS 2 (TAT 6-24 HRS)    EKG None  Radiology No results found.  Procedures Procedures   Medications Ordered in ED Medications  ondansetron (ZOFRAN-ODT) disintegrating tablet 4 mg (4 mg Oral Given 11/20/20 2122)    ED Course  I have reviewed the triage vital signs and the nursing notes.  Pertinent labs & imaging results that were available during my care of the patient were reviewed by me and considered in my medical decision making (see chart for details).    MDM Rules/Calculators/A&P                          Erik Barr was evaluated in Emergency Department on 11/20/2020 for the symptoms described in the history of present illness. He was evaluated in the context of the  global COVID-19 pandemic, which necessitated consideration that the patient might be at risk for infection with the SARS-CoV-2 virus that causes COVID-19. Institutional protocols and algorithms that pertain to the evaluation of patients at risk for COVID-19 are in a state of rapid change based on information  released by regulatory bodies including the CDC and federal and state organizations. These policies and algorithms were followed during the patient's care in the ED.  I personally reviewed patient's medical chart and all notes from triage and staff during today's encounter. I have also ordered and reviewed all labs and imaging that I felt to be medically necessary in the evaluation of this patient's complaints and with consideration of their physical exam. If needed, translation services were available and utilized.   Given brevity of illness and only 3 episodes of nonbloody emesis, do not feel as though labs would yield any significant findings.  Low suspicion for electrolyte derangement or AKI.  His abdomen is soft, nontender.  His vital signs are stable here in the ED aside from mildly elevated heart rate.  He has not been able to keep down food or liquid today which is likely contributing to his elevated heart rate.  Will provide Zofran ODT and p.o. challenge.  Patient is not particularly ill-appearing.  If he can eat and drink, feel as though he is reasonable for discharge home.  We will obtain COVID-19 PCR testing prior to discharge.  He can follow-up with primary care provider for ongoing evaluation and management.  He admits that he likely would not have come to the ED if not for his girlfriend's insistence.  On subsequent evaluation, patient is feeling improved after Zofran ODT.  He has not had any episodes of nausea vomiting here in the ED and has been able to eat cantaloupe that was provided by his girlfriend.  He is drinking plenty fluids.  Feels improved and prepared for discharge.  Will  follow up with his primary care provider regarding today's ED encounter.  Patient to maintain isolation precautions pending results of his COVID-19 testing.  If negative, suspect viral GI illness versus hangover from drinking with his friends.  Doubt emergent pathology.  However, strict ED return precautions should he develop any severe abdominal pain or intractable nausea vomiting.  Also encouraged him to check his temperature regularly.  Patient voices understanding and is agreeable.   Final Clinical Impression(s) / ED Diagnoses Final diagnoses:  Non-intractable vomiting with nausea, unspecified vomiting type    Rx / DC Orders ED Discharge Orders         Ordered    ondansetron (ZOFRAN ODT) 4 MG disintegrating tablet  Every 8 hours PRN        11/20/20 2254           Lorelee New, PA-C 11/20/20 2258    Gerhard Munch, MD 11/21/20 2342

## 2020-11-20 NOTE — Discharge Instructions (Addendum)
Please read the attachment.  I am glad that your symptoms have improved with Zofran.  Please continue to take, as needed.  Drink plenty of fluids and eat regular meals.  Follow-up with your primary care provider regarding today's ED encounter and for ongoing evaluation and management.  Please maintain isolation precautions pending results of your COVID-19 testing.  Return to the ED or seek immediate medical attention should you experience any new or worsening symptoms. Including but not limited to any abdominal pain or intractable nausea/vomiting.

## 2020-11-20 NOTE — ED Triage Notes (Signed)
Pt reports nausea, generalized weakness, body aches and headache.

## 2020-11-21 LAB — SARS CORONAVIRUS 2 (TAT 6-24 HRS): SARS Coronavirus 2: NEGATIVE

## 2022-12-25 ENCOUNTER — Emergency Department (HOSPITAL_COMMUNITY): Payer: Self-pay

## 2022-12-25 ENCOUNTER — Emergency Department (HOSPITAL_COMMUNITY)
Admission: EM | Admit: 2022-12-25 | Discharge: 2022-12-25 | Disposition: A | Discharge status: Home / Self Care | Payer: Self-pay | Attending: Emergency Medicine | Admitting: Emergency Medicine

## 2022-12-25 ENCOUNTER — Other Ambulatory Visit: Payer: Self-pay

## 2022-12-25 ENCOUNTER — Encounter (HOSPITAL_COMMUNITY): Payer: Self-pay

## 2022-12-25 ENCOUNTER — Ambulatory Visit: Payer: Self-pay

## 2022-12-25 DIAGNOSIS — T07XXXA Unspecified multiple injuries, initial encounter: Secondary | ICD-10-CM

## 2022-12-25 DIAGNOSIS — F10920 Alcohol use, unspecified with intoxication, uncomplicated: Secondary | ICD-10-CM | POA: Insufficient documentation

## 2022-12-25 DIAGNOSIS — S298XXA Other specified injuries of thorax, initial encounter: Secondary | ICD-10-CM | POA: Insufficient documentation

## 2022-12-25 DIAGNOSIS — Y92410 Unspecified street and highway as the place of occurrence of the external cause: Secondary | ICD-10-CM | POA: Insufficient documentation

## 2022-12-25 DIAGNOSIS — S060XAA Concussion with loss of consciousness status unknown, initial encounter: Secondary | ICD-10-CM | POA: Insufficient documentation

## 2022-12-25 DIAGNOSIS — Z23 Encounter for immunization: Secondary | ICD-10-CM | POA: Insufficient documentation

## 2022-12-25 DIAGNOSIS — Y908 Blood alcohol level of 240 mg/100 ml or more: Secondary | ICD-10-CM | POA: Insufficient documentation

## 2022-12-25 LAB — COMPREHENSIVE METABOLIC PANEL
ALT: 116 U/L — ABNORMAL HIGH (ref 0–44)
AST: 130 U/L — ABNORMAL HIGH (ref 15–41)
Albumin: 4.6 g/dL (ref 3.5–5.0)
Alkaline Phosphatase: 65 U/L (ref 38–126)
Anion gap: 15 (ref 5–15)
BUN: 11 mg/dL (ref 6–20)
CO2: 22 mmol/L (ref 22–32)
Calcium: 8.9 mg/dL (ref 8.9–10.3)
Chloride: 98 mmol/L (ref 98–111)
Creatinine, Ser: 1.08 mg/dL (ref 0.61–1.24)
GFR, Estimated: >60 mL/min (ref >60)
Glucose, Bld: 97 mg/dL (ref 70–99)
Potassium: 3.5 mmol/L (ref 3.5–5.1)
Sodium: 135 mmol/L (ref 135–145)
Total Bilirubin: 0.3 mg/dL (ref 0.3–1.2)
Total Protein: 7.5 g/dL (ref 6.5–8.1)

## 2022-12-25 LAB — CBC
HCT: 44.9 % (ref 39.0–52.0)
Hemoglobin: 15.6 g/dL (ref 13.0–17.0)
MCH: 30.6 pg (ref 26.0–34.0)
MCHC: 34.7 g/dL (ref 30.0–36.0)
MCV: 88.2 fL (ref 80.0–100.0)
Platelets: 355 10*3/uL (ref 150–400)
RBC: 5.09 MIL/uL (ref 4.22–5.81)
RDW: 10.8 % — ABNORMAL LOW (ref 11.5–15.5)
WBC: 7.2 10*3/uL (ref 4.0–10.5)
nRBC: 0 % (ref 0.0–0.2)

## 2022-12-25 LAB — ETHANOL: Alcohol, Ethyl (B): 366 mg/dL (ref <10)

## 2022-12-25 MED ORDER — TETANUS-DIPHTH-ACELL PERTUSSIS 5-2.5-18.5 LF-MCG/0.5 IM SUSY
0.5000 mL | PREFILLED_SYRINGE | Freq: Once | INTRAMUSCULAR | Status: AC
  Administered 2022-12-25: 0.5 mL via INTRAMUSCULAR
  Filled 2022-12-25: qty 0.5

## 2022-12-25 MED ORDER — IOHEXOL 350 MG/ML SOLN
75.0000 mL | Freq: Once | INTRAVENOUS | Status: AC | PRN
  Administered 2022-12-25: 75 mL via INTRAVENOUS
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
## 2022-12-25 NOTE — ED Notes (Signed)
MD Wickline request to place own orders.
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
## 2022-12-25 NOTE — ED Notes (Signed)
Patient transported to CT 
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
## 2022-12-27 ENCOUNTER — Encounter (HOSPITAL_BASED_OUTPATIENT_CLINIC_OR_DEPARTMENT_OTHER): Payer: Self-pay

## 2022-12-27 ENCOUNTER — Other Ambulatory Visit: Payer: Self-pay

## 2022-12-27 ENCOUNTER — Emergency Department (HOSPITAL_BASED_OUTPATIENT_CLINIC_OR_DEPARTMENT_OTHER): Payer: BC Managed Care – PPO

## 2022-12-27 ENCOUNTER — Emergency Department (HOSPITAL_BASED_OUTPATIENT_CLINIC_OR_DEPARTMENT_OTHER)
Admission: EM | Admit: 2022-12-27 | Discharge: 2022-12-27 | Disposition: A | Discharge status: Home / Self Care | Payer: BC Managed Care – PPO | Attending: Emergency Medicine | Admitting: Emergency Medicine

## 2022-12-27 ENCOUNTER — Emergency Department (HOSPITAL_BASED_OUTPATIENT_CLINIC_OR_DEPARTMENT_OTHER): Payer: BC Managed Care – PPO | Admitting: Radiology

## 2022-12-27 ENCOUNTER — Other Ambulatory Visit (HOSPITAL_BASED_OUTPATIENT_CLINIC_OR_DEPARTMENT_OTHER): Payer: Self-pay

## 2022-12-27 DIAGNOSIS — E875 Hyperkalemia: Secondary | ICD-10-CM | POA: Insufficient documentation

## 2022-12-27 DIAGNOSIS — Y9351 Activity, roller skating (inline) and skateboarding: Secondary | ICD-10-CM | POA: Insufficient documentation

## 2022-12-27 DIAGNOSIS — R1031 Right lower quadrant pain: Secondary | ICD-10-CM | POA: Insufficient documentation

## 2022-12-27 DIAGNOSIS — Y92410 Unspecified street and highway as the place of occurrence of the external cause: Secondary | ICD-10-CM | POA: Insufficient documentation

## 2022-12-27 DIAGNOSIS — R1011 Right upper quadrant pain: Secondary | ICD-10-CM | POA: Insufficient documentation

## 2022-12-27 DIAGNOSIS — R109 Unspecified abdominal pain: Secondary | ICD-10-CM

## 2022-12-27 LAB — BASIC METABOLIC PANEL
Anion gap: 15 (ref 5–15)
BUN: 10 mg/dL (ref 6–20)
CO2: 26 mmol/L (ref 22–32)
Calcium: 9.5 mg/dL (ref 8.9–10.3)
Chloride: 97 mmol/L — ABNORMAL LOW (ref 98–111)
Creatinine, Ser: 0.81 mg/dL (ref 0.61–1.24)
GFR, Estimated: >60 mL/min (ref >60)
Glucose, Bld: 86 mg/dL (ref 70–99)
Potassium: 3.6 mmol/L (ref 3.5–5.1)
Sodium: 138 mmol/L (ref 135–145)

## 2022-12-27 LAB — CBC
HCT: 44 % (ref 39.0–52.0)
Hemoglobin: 15.4 g/dL (ref 13.0–17.0)
MCH: 30.6 pg (ref 26.0–34.0)
MCHC: 35 g/dL (ref 30.0–36.0)
MCV: 87.3 fL (ref 80.0–100.0)
Platelets: 307 10*3/uL (ref 150–400)
RBC: 5.04 MIL/uL (ref 4.22–5.81)
RDW: 10.9 % — ABNORMAL LOW (ref 11.5–15.5)
WBC: 6.3 10*3/uL (ref 4.0–10.5)
nRBC: 0 % (ref 0.0–0.2)

## 2022-12-27 LAB — HEPATIC FUNCTION PANEL
ALT: 121 U/L — ABNORMAL HIGH (ref 0–44)
AST: 154 U/L — ABNORMAL HIGH (ref 15–41)
Albumin: 4.9 g/dL (ref 3.5–5.0)
Alkaline Phosphatase: 69 U/L (ref 38–126)
Bilirubin, Direct: 0.1 mg/dL (ref 0.0–0.2)
Indirect Bilirubin: 0.5 mg/dL (ref 0.3–0.9)
Total Bilirubin: 0.6 mg/dL (ref 0.3–1.2)
Total Protein: 7.8 g/dL (ref 6.5–8.1)

## 2022-12-27 LAB — TROPONIN I (HIGH SENSITIVITY): Troponin I (High Sensitivity): <2 ng/L (ref <18)

## 2022-12-27 MED ORDER — IOHEXOL 300 MG/ML  SOLN
100.0000 mL | Freq: Once | INTRAMUSCULAR | Status: AC | PRN
  Administered 2022-12-27: 80 mL via INTRAVENOUS

## 2022-12-27 MED ORDER — IBUPROFEN 600 MG PO TABS
600.0000 mg | ORAL_TABLET | Freq: Four times a day (QID) | ORAL | 0 refills | Status: DC | PRN
  Filled 2022-12-27: qty 30, 8d supply, fill #0

## 2022-12-27 MED ORDER — CELECOXIB 200 MG PO CAPS
200.0000 mg | ORAL_CAPSULE | Freq: Two times a day (BID) | ORAL | 0 refills | Status: AC
  Filled 2022-12-27: qty 60, 30d supply, fill #0

## 2022-12-27 MED ORDER — KETOROLAC TROMETHAMINE 15 MG/ML IJ SOLN
15.0000 mg | Freq: Once | INTRAMUSCULAR | Status: AC
  Administered 2022-12-27: 15 mg via INTRAVENOUS
  Filled 2022-12-27: qty 1

## 2022-12-27 NOTE — Discharge Instructions (Addendum)
As discussed, CT imaging of your abdomen was negative for any acute abnormality.  Suspect pain is musculoskeletal in nature.  Continue take anti-inflammatories as prescribed.  Recommend follow-up with primary care for reassessment of your symptoms.  Please do not hesitate to return to emergency department the worrisome signs and symptoms we discussed become apparent.
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
